# Patient Record
Sex: Female | Born: 1970 | Race: White | Hispanic: No | Marital: Married | Smoking: Current every day smoker
Health system: Southern US, Community
[De-identification: ages and names within clinical notes are randomized; demographics above are authoritative.]

## PROBLEM LIST (undated history)

## (undated) DIAGNOSIS — F419 Anxiety disorder, unspecified: Secondary | ICD-10-CM

## (undated) DIAGNOSIS — M549 Dorsalgia, unspecified: Secondary | ICD-10-CM

## (undated) HISTORY — PX: BACK SURGERY: SHX140

---

## 2001-08-12 ENCOUNTER — Emergency Department (HOSPITAL_COMMUNITY): Admission: EM | Admit: 2001-08-12 | Discharge: 2001-08-12 | Payer: Self-pay | Admitting: Emergency Medicine

## 2007-09-07 ENCOUNTER — Ambulatory Visit (HOSPITAL_COMMUNITY): Admission: RE | Admit: 2007-09-07 | Discharge: 2007-09-08 | Payer: Self-pay | Admitting: Neurosurgery

## 2007-12-11 ENCOUNTER — Encounter: Admission: RE | Admit: 2007-12-11 | Discharge: 2007-12-11 | Payer: Self-pay | Admitting: Neurosurgery

## 2008-03-03 ENCOUNTER — Encounter: Admission: RE | Admit: 2008-03-03 | Discharge: 2008-03-03 | Payer: Self-pay | Admitting: Neurosurgery

## 2008-07-23 ENCOUNTER — Encounter: Admission: RE | Admit: 2008-07-23 | Discharge: 2008-07-23 | Payer: Self-pay | Admitting: Neurosurgery

## 2009-08-27 ENCOUNTER — Ambulatory Visit (HOSPITAL_COMMUNITY): Admission: RE | Admit: 2009-08-27 | Discharge: 2009-08-27 | Payer: Self-pay | Admitting: Neurology

## 2010-06-21 ENCOUNTER — Encounter: Payer: Self-pay | Admitting: Neurosurgery

## 2010-10-12 NOTE — Op Note (Signed)
Julia Kennedy, Julia Kennedy NO.:  [id number]   MEDICAL RECORD NO.:  [id number]          PATIENT TYPE:  INP   LOCATION:  3526                         FACILITY:  [facility]   PHYSICIAN:  Hewitt Shorts, M.D.DATE OF BIRTH:  18-Oct-1970   DATE OF PROCEDURE:  DATE OF DISCHARGE:                               OPERATIVE REPORT   PREOPERATIVE DIAGNOSES:  1. Right L4-L5 and right L5-S1 lumbar disc herniation.  2. Degenerative disc disease.  3. Lumbar spondylosis.  4. Lumbar radiculopathy.   POSTOPERATIVE DIAGNOSES:  1. Right L4-L5 and right L5-S1 lumbar disc herniation.  2. Degenerative disc disease.  3. Lumbar spondylosis.  4. Lumbar radiculopathy.   PROCEDURES:  Right L4-L5 and L5-S1 lumbar laminotomy and microdiskectomy  with microdissection.   ASSISTANT:  Dr. Phoebe Perch   ANESTHESIA:  General endotracheal.   INDICATIONS:  The patient is a 40 year old woman who presented with a  right lumbar radiculopathy.  MRI scan revealed disc herniation is  essentially towards the right at both L4-5 and L5-S1 with underlying  degenerative disc disease and spondylosis.  Decision was made to proceed  with II-level lumbar laminotomy and microdiskectomy.   DESCRIPTION OF OPERATIVE PROCEDURE:  The patient was brought to the  operating room and placed under general endotracheal anesthesia.  The  patient was turned to a prone position.  Lumbar region was prepped with  Betadine soap and solution and draped in a sterile fashion.  The midline  was infiltrated with local anesthetic with epinephrine and x-ray was  taken.  The L4-L5 and L5-S1 levels were identified and a midline  incision was made and carried down through the subcutaneous tissue.  Bipolar electrocautery was used to maintain hemostasis.  Dissection was  carried down to the lumbar fascia, which was incised to the right side  of the midline and the paraspinal muscles were dissected from the  spinous process and lamina in a  subperiosteal fashion.  The L4-L5 and L5-  S1 levels were identified and another x-ray was taken to confirm the  localization.  Then, the microscope was draped and brought to the field  to provide additional navigation, illumination, and visualization.  The  remainder of the decompression was performed using microdissection and  microsurgical technique.   Laminotomy was performed on the right side of both L4-5 and L5-S1 using  the X-Max drill and Kerrison punches at each level.  The ligamentum  flavum was carefully removed and we identified the thecal sac and  exiting nerve roots.  Epidural veins were coagulated and divided as  needed and we were able to gently retract the thecal sac and nerve root  at each level and identify the disk herniation at each level.  Sketching  was performed at each level with incision of the annulus achieved with  microcurettes and pituitary rongeurs.  There is significant disc  herniation at each level.  At each level we achieved good decompression  by removing all loose fragments of disc material from both the disc  space and the epidural space.  The wound was irrigated at each level  with Bacitracin solution.  Hemostasis  was checked for and confirmed and  once decompression was completed and hemostasis was achieved we  instilled 2 mL of fentanyl and 80 mg of Depo-Medrol into the epidural  space and proceeded with closure.  The deep fascia was closed with  interrupted undyed #1 Vicryl sutures.  Scarpa fascia was closed with  interrupted undyed #1 and 2-0 undyed Vicryl sutures and the subcutaneous  and subcuticular were closed with interrupted inverted 2-0 undyed Vicryl  sutures and the skin was approximated with Dermabond.  The procedure was  tolerated well.  The estimated blood loss was 50 mL.  Sponge and needle  count were correct.  Following surgery, the patient was returned back to  the supine position, reversed from the anesthetic, extubated, and   transferred to the recovery room for further care.      Hewitt Shorts, M.D.  Electronically Signed     RWN/MEDQ  D:  09/07/2007  T:  09/08/2007  Job:  [id number]

## 2011-02-22 LAB — CBC
HCT: 39.9
Hemoglobin: 13.8
MCHC: 34.7
MCV: 88.7
Platelets: 277
RBC: 4.49
RDW: 14.4
WBC: 8.5

## 2011-05-08 ENCOUNTER — Emergency Department (HOSPITAL_COMMUNITY)
Admission: EM | Admit: 2011-05-08 | Discharge: 2011-05-08 | Disposition: A | Discharge status: Home / Self Care | Payer: Self-pay | Attending: Emergency Medicine | Admitting: Emergency Medicine

## 2011-05-08 ENCOUNTER — Encounter: Payer: Self-pay | Admitting: *Deleted

## 2011-05-08 ENCOUNTER — Other Ambulatory Visit: Payer: Self-pay

## 2011-05-08 ENCOUNTER — Emergency Department (HOSPITAL_COMMUNITY): Payer: Self-pay

## 2011-05-08 DIAGNOSIS — F17200 Nicotine dependence, unspecified, uncomplicated: Secondary | ICD-10-CM | POA: Insufficient documentation

## 2011-05-08 DIAGNOSIS — J189 Pneumonia, unspecified organism: Secondary | ICD-10-CM | POA: Insufficient documentation

## 2011-05-08 HISTORY — DX: Dorsalgia, unspecified: M54.9

## 2011-05-08 HISTORY — DX: Anxiety disorder, unspecified: F41.9

## 2011-05-08 MED ORDER — ONDANSETRON 8 MG PO TBDP
8.0000 mg | ORAL_TABLET | Freq: Once | ORAL | Status: AC
  Administered 2011-05-08: 8 mg via ORAL
  Filled 2011-05-08: qty 1

## 2011-05-08 MED ORDER — CEFTRIAXONE SODIUM 1 G IJ SOLR
1.0000 g | Freq: Once | INTRAMUSCULAR | Status: AC
  Administered 2011-05-08: 1 g via INTRAMUSCULAR
  Filled 2011-05-08: qty 10

## 2011-05-08 MED ORDER — OXYCODONE-ACETAMINOPHEN 5-325 MG PO TABS
1.0000 | ORAL_TABLET | Freq: Once | ORAL | Status: AC
  Administered 2011-05-08: 1 via ORAL
  Filled 2011-05-08: qty 1

## 2011-05-08 MED ORDER — ACETAMINOPHEN 500 MG PO TABS
1000.0000 mg | ORAL_TABLET | Freq: Once | ORAL | Status: DC
  Filled 2011-05-08: qty 2

## 2011-05-08 MED ORDER — LIDOCAINE HCL (PF) 1 % IJ SOLN
INTRAMUSCULAR | Status: AC
  Administered 2011-05-08: 2.1 mL
  Filled 2011-05-08: qty 5

## 2011-05-08 MED ORDER — AZITHROMYCIN 250 MG PO TABS: ORAL_TABLET | ORAL | Status: DC

## 2011-05-08 MED ORDER — GUAIFENESIN-CODEINE 100-10 MG/5ML PO SYRP: 10.0000 mL | ORAL_SOLUTION | Freq: Three times a day (TID) | ORAL | Status: AC | PRN

## 2011-05-08 MED ORDER — IBUPROFEN 800 MG PO TABS
800.0000 mg | ORAL_TABLET | Freq: Once | ORAL | Status: AC
  Administered 2011-05-08: 800 mg via ORAL
  Filled 2011-05-08: qty 1

## 2011-05-08 NOTE — ED Notes (Signed)
Pt reports chest/wall pain starting 1 week ago and worsening today, pt reports increased chest pain with respiration and coughing

## 2011-05-08 NOTE — ED Notes (Signed)
Pt instructed to remove jacket and blanket to help decrease her temp.

## 2011-05-08 NOTE — ED Provider Notes (Signed)
History     CSN: [id number] Arrival date & time: 05/08/2011  8:25 PM   First MD Initiated Contact with Patient 05/08/11 2048      Chief Complaint  Patient presents with  . Chest Pain  . Cough  . Anxiety    (Consider location/radiation/quality/duration/timing/severity/associated sxs/prior treatment) Patient is a 40 y.o. female presenting with cough. The history is provided by the patient.  Cough This is a new problem. The current episode started more than 2 days ago. The problem occurs every few minutes. The problem has been gradually worsening. The cough is productive of sputum. The maximum temperature recorded prior to her arrival was 102 to 102.9 F. The fever has been present for 1 to 2 days. Associated symptoms include chest pain, chills, sweats and myalgias. Pertinent negatives include no headaches, no rhinorrhea, no sore throat, no shortness of breath and no wheezing. She has tried nothing for the symptoms. The treatment provided no relief. She is a smoker. Her past medical history is significant for bronchitis. Her past medical history does not include pneumonia or asthma.    Past Medical History  Diagnosis Date  . Anxiety   . Back pain     History reviewed. No pertinent past surgical history.  History reviewed. No pertinent family history.  History  Substance Use Topics  . Smoking status: Current Everyday Smoker -- 1.0 packs/day  . Smokeless tobacco: Not on file  . Alcohol Use: No    OB History    Grav Para Term Preterm Abortions TAB SAB Ect Mult Living                  Review of Systems  Constitutional: Positive for fever and chills. Negative for activity change, appetite change and fatigue.  HENT: Positive for congestion. Negative for sore throat, rhinorrhea, trouble swallowing, neck pain and neck stiffness.   Respiratory: Positive for cough and chest tightness. Negative for shortness of breath and wheezing.   Cardiovascular: Positive for chest pain. Negative  for palpitations.  Gastrointestinal: Negative for nausea, vomiting and abdominal pain.  Genitourinary: Negative for dysuria.  Musculoskeletal: Positive for myalgias. Negative for arthralgias.  Skin: Negative for rash.  Neurological: Negative for dizziness, weakness, numbness and headaches.  Hematological: Does not bruise/bleed easily.    Allergies  Review of patient's allergies indicates no known allergies.  Home Medications  No current outpatient prescriptions on file.  BP 132/75  Pulse 96  Temp(Src) 99.2 F (37.3 C) (Oral)  Resp 24  Ht 5\' 7"  (1.702 m)  Wt 147 lb (66.679 kg)  BMI 23.02 kg/m2  SpO2 96%  LMP 04/27/2011  Physical Exam  Nursing note and vitals reviewed. Constitutional: She is oriented to person, place, and time. She appears well-developed and well-nourished.  Non-toxic appearance. She does not have a sickly appearance. No distress.  HENT:  Head: Normocephalic and atraumatic.  Right Ear: Tympanic membrane and ear canal normal.  Left Ear: Tympanic membrane and ear canal normal.  Mouth/Throat: Uvula is midline and mucous membranes are normal. Posterior oropharyngeal erythema present. No oropharyngeal exudate, posterior oropharyngeal edema or tonsillar abscesses.  Neck: Normal range of motion. Neck supple.  Cardiovascular: Normal rate, regular rhythm and normal heart sounds.   Pulmonary/Chest: Effort normal. No respiratory distress. She has no wheezes. She has no rales. She exhibits no tenderness.       Lung sounds are coarse bilaterally.  PAtient actively coughing.  No rales or wheezing  Abdominal: Soft. She exhibits no distension. There is no  tenderness.  Musculoskeletal: Normal range of motion. She exhibits no tenderness.  Lymphadenopathy:    She has no cervical adenopathy.  Neurological: She is alert and oriented to person, place, and time. No cranial nerve deficit. She exhibits normal muscle tone. Coordination normal.  Skin: Skin is warm and dry.    Psychiatric: Her speech is normal and behavior is normal. Thought content normal. Her mood appears anxious.    ED Course  Procedures (including critical care time)  Dg Chest 2 View  05/08/2011  *RADIOLOGY REPORT*  Clinical Data: Cough and fever  CHEST - 2 VIEW  Comparison: None.  Findings: Patchy right infrahilar airspace disease is evident. Left lung is clear. The cardiopericardial silhouette is within normal limits for size. Imaged bony structures of the thorax are intact.  IMPRESSION: Patchy airspace disease in the right base, suspicious for pneumonia.  Original Report Authenticated By: ERIC A. MANSELL, M.D.        MDM     Date: 05/08/2011  Rate: 97  Rhythm: normal sinus rhythm  QRS Axis: normal  Intervals: normal  ST/T Wave abnormalities: normal  Conduction Disutrbances:none  Narrative Interpretation:   Old EKG Reviewed: none available    EKG read by Dr. Clarene Duke  10:03 PM patient is feeling better, vitals improved.  No active vomiting. Non-toxic appearing.    No tachycardia or tachypnea, or hypoxia. Onset of sx's one week ago.  Given suspicion for PNA on x-ray, I will give rocephin now and start zithromax.  Pt agrees to return here in 1-2 days if the sx's are not improving  Pt feels improved after observation and/or treatment in ED.   Patient / Family / Caregiver understand and agree with initial ED impression and plan with expectations set for ED visit.   Johnpatrick Jenny L. [place], [place] 05/10/11 2153

## 2011-05-11 NOTE — ED Provider Notes (Signed)
Medical screening examination/treatment/procedure(s) were performed by non-physician practitioner and as supervising physician I was immediately available for consultation/collaboration.   Tremaine Earwood M Teaghan Formica, DO 05/11/11 1508 

## 2011-05-25 ENCOUNTER — Emergency Department (HOSPITAL_COMMUNITY): Payer: Self-pay

## 2011-05-25 ENCOUNTER — Encounter (HOSPITAL_COMMUNITY): Payer: Self-pay | Admitting: Emergency Medicine

## 2011-05-25 ENCOUNTER — Emergency Department (HOSPITAL_COMMUNITY)
Admission: EM | Admit: 2011-05-25 | Discharge: 2011-05-25 | Disposition: A | Discharge status: Home / Self Care | Payer: Self-pay | Attending: Emergency Medicine | Admitting: Emergency Medicine

## 2011-05-25 DIAGNOSIS — R10814 Left lower quadrant abdominal tenderness: Secondary | ICD-10-CM | POA: Insufficient documentation

## 2011-05-25 DIAGNOSIS — R109 Unspecified abdominal pain: Secondary | ICD-10-CM | POA: Insufficient documentation

## 2011-05-25 DIAGNOSIS — F329 Major depressive disorder, single episode, unspecified: Secondary | ICD-10-CM

## 2011-05-25 DIAGNOSIS — F3289 Other specified depressive episodes: Secondary | ICD-10-CM | POA: Insufficient documentation

## 2011-05-25 DIAGNOSIS — F17200 Nicotine dependence, unspecified, uncomplicated: Secondary | ICD-10-CM | POA: Insufficient documentation

## 2011-05-25 DIAGNOSIS — F411 Generalized anxiety disorder: Secondary | ICD-10-CM | POA: Insufficient documentation

## 2011-05-25 LAB — URINALYSIS, ROUTINE W REFLEX MICROSCOPIC
Bilirubin Urine: NEGATIVE
Nitrite: NEGATIVE
Protein, ur: NEGATIVE mg/dL
Specific Gravity, Urine: 1.01 (ref 1.005–1.030)
Urobilinogen, UA: 0.2 mg/dL (ref 0.0–1.0)
pH: 6 (ref 5.0–8.0)

## 2011-05-25 LAB — COMPREHENSIVE METABOLIC PANEL
ALT: <5 U/L (ref 0–35)
AST: 6 U/L (ref 0–37)
Albumin: 3.4 g/dL — ABNORMAL LOW (ref 3.5–5.2)
Alkaline Phosphatase: 65 U/L (ref 39–117)
CO2: 29 mEq/L (ref 19–32)
Chloride: 99 mEq/L (ref 96–112)
GFR calc Af Amer: >90 mL/min (ref >90)
GFR calc non Af Amer: >90 mL/min (ref >90)
Potassium: 2.9 mEq/L — ABNORMAL LOW (ref 3.5–5.1)
Sodium: 136 mEq/L (ref 135–145)
Total Bilirubin: 0.4 mg/dL (ref 0.3–1.2)

## 2011-05-25 LAB — DIFFERENTIAL
Basophils Relative: 0 % (ref 0–1)
Eosinophils Relative: 1 % (ref 0–5)
Lymphocytes Relative: 27 % (ref 12–46)

## 2011-05-25 LAB — CBC
HCT: 41.3 % (ref 36.0–46.0)
Hemoglobin: 13.8 g/dL (ref 12.0–15.0)
MCH: 29 pg (ref 26.0–34.0)
MCHC: 33.4 g/dL (ref 30.0–36.0)
RDW: 13.7 % (ref 11.5–15.5)

## 2011-05-25 LAB — RAPID URINE DRUG SCREEN, HOSP PERFORMED
Benzodiazepines: POSITIVE — AB
Opiates: NOT DETECTED
Tetrahydrocannabinol: NOT DETECTED

## 2011-05-25 LAB — URINE MICROSCOPIC-ADD ON

## 2011-05-25 LAB — PREGNANCY, URINE: Preg Test, Ur: NEGATIVE

## 2011-05-25 LAB — LIPASE, BLOOD: Lipase: 18 U/L (ref 11–59)

## 2011-05-25 MED ORDER — POTASSIUM CHLORIDE CRYS ER 20 MEQ PO TBCR
20.0000 meq | EXTENDED_RELEASE_TABLET | Freq: Once | ORAL | Status: AC
  Administered 2011-05-25: 20 meq via ORAL
  Filled 2011-05-25: qty 1

## 2011-05-25 MED ORDER — SODIUM CHLORIDE 0.9 % IV BOLUS (SEPSIS)
1000.0000 mL | Freq: Once | INTRAVENOUS | Status: AC
  Administered 2011-05-25: 1000 mL via INTRAVENOUS

## 2011-05-25 MED ORDER — IOHEXOL 300 MG/ML  SOLN
100.0000 mL | Freq: Once | INTRAMUSCULAR | Status: AC | PRN
  Administered 2011-05-25: 100 mL via INTRAVENOUS

## 2011-05-25 MED ORDER — PANTOPRAZOLE SODIUM 40 MG IV SOLR
40.0000 mg | Freq: Once | INTRAVENOUS | Status: AC
  Administered 2011-05-25: 40 mg via INTRAVENOUS
  Filled 2011-05-25: qty 40

## 2011-05-25 MED ORDER — ONDANSETRON HCL 4 MG/2ML IJ SOLN
4.0000 mg | Freq: Once | INTRAMUSCULAR | Status: AC
  Administered 2011-05-25: 4 mg via INTRAVENOUS
  Filled 2011-05-25: qty 2

## 2011-05-25 MED ORDER — RANITIDINE HCL 150 MG PO CAPS: 150.0000 mg | ORAL_CAPSULE | Freq: Two times a day (BID) | ORAL | Status: AC

## 2011-05-25 NOTE — ED Notes (Signed)
Patient not eating x3 weeks. Per best friend patient depressed recently seprated from husband. Patient c/o mid abd pain with nausea and vomiting.

## 2011-05-25 NOTE — ED Provider Notes (Signed)
History    This chart was scribed for Julia Lennert, MD, MD by Julia Kennedy. The patient was seen in room APA15 and the patient's care was started at 5:50PM.   CSN: [id number]  Arrival date & time 05/25/11  1557   First MD Initiated Contact with Patient 05/25/11 1748      Chief Complaint  Patient presents with  . Depression    not eating  . Abdominal Pain  . Medical Clearance    (Consider location/radiation/quality/duration/timing/severity/associated sxs/prior treatment) Patient is a 40 y.o. female presenting with abdominal pain. The history is provided by the patient.  Abdominal Pain The primary symptoms of the illness include abdominal pain.   Julia Kennedy is a 40 y.o. female who presents to the Emergency Department complaining of not being able to eat onset 3 weeks.She states she was depressed due to husband leaving her 3 weeks ago. Pt has been seeing a psychiatrist for anxiety but he doesn't know about this specific situation. Symptoms have been constant since onset. She denies SI and HI. Eating food causes pt to vomit. Pt denies any other pain. She reports having nausea. Pt has had back surgery 9 months ago and c-section 9 years ago. Pt reports the flank pain that just started hurting.   Past Medical History  Diagnosis Date  . Anxiety   . Back pain     Past Surgical History  Procedure Date  . Back surgery     Family History  Problem Relation Age of Onset  . Cancer Other   . Stroke Other   . Seizures Other   . Diabetes Other   . Hypertension Other     History  Substance Use Topics  . Smoking status: Current Everyday Smoker -- 1.0 packs/day for 20 years    Types: Cigarettes  . Smokeless tobacco: Never Used  . Alcohol Use: No    OB History    Grav Para Term Preterm Abortions TAB SAB Ect Mult Living   4 4 4       4       Review of Systems  Gastrointestinal: Positive for abdominal pain.  All other systems reviewed and are negative.   10 Systems  reviewed and are negative for acute change except as noted in the HPI.  Allergies  Review of patient's allergies indicates no known allergies.  Home Medications   Current Outpatient Rx  Name Route Sig Dispense Refill  . ALPRAZOLAM 2 MG PO TABS Oral Take 2 mg by mouth 5 (five) times daily as needed. For anxiety     . HYDROCODONE-ACETAMINOPHEN 7.5-500 MG PO TABS Oral Take 1 tablet by mouth every 6 (six) hours as needed. For pain     . NABUMETONE 750 MG PO TABS Oral Take 750 mg by mouth 2 (two) times daily.      Marland Kitchen RANITIDINE HCL 150 MG PO CAPS Oral Take 1 capsule (150 mg total) by mouth 2 (two) times daily. 60 capsule 0    BP 146/105  Pulse 92  Temp(Src) 98.2 F (36.8 C) (Oral)  Resp 18  Ht 5\' 11"  (1.803 m)  Wt 137 lb 1.6 oz (62.188 kg)  BMI 19.12 kg/m2  SpO2 100%  LMP 05/17/2011  Physical Exam  Nursing note and vitals reviewed. Constitutional: She is oriented to person, place, and time. She appears well-developed and well-nourished. No distress.  HENT:  Head: Normocephalic and atraumatic.  Eyes: Conjunctivae and EOM are normal. No scleral icterus.  Neck: Neck supple. No  thyromegaly present.  Cardiovascular: Normal rate and regular rhythm.  Exam reveals no gallop and no friction rub.   No murmur heard. Pulmonary/Chest: No stridor. No respiratory distress. She has no wheezes. She has no rales. She exhibits no tenderness.  Abdominal: She exhibits no distension. There is Tenderness: moderate LLQ.Marland Kitchen There is no rebound.  Musculoskeletal: Normal range of motion. She exhibits no edema.  Lymphadenopathy:    She has no cervical adenopathy.  Neurological: She is oriented to person, place, and time. Coordination normal.  Skin: No rash noted. No erythema.  Psychiatric: Her behavior is normal.       Depressed and hasnt been eating. Not SI or HI    ED Course  Procedures (including critical care time)  DIAGNOSTIC STUDIES: Oxygen Saturation is 100% on room air, normal by my  interpretation.    COORDINATION OF CARE:  7:47PM: Recheck: EDP discusses lab results and treatment course/ physiatrist appointment.      Labs Reviewed  COMPREHENSIVE METABOLIC PANEL - Abnormal; Notable for the following:    Potassium 2.9 (*)    Albumin 3.4 (*)    All other components within normal limits  CBC  DIFFERENTIAL  ETHANOL  LIPASE, BLOOD  URINALYSIS, ROUTINE W REFLEX MICROSCOPIC  PREGNANCY, URINE  URINE RAPID DRUG SCREEN (HOSP PERFORMED)   Ct Abdomen Pelvis W Contrast  05/25/2011  *RADIOLOGY REPORT*  Clinical Data: Abdominal pain, back pain, medical clearance  CT ABDOMEN AND PELVIS WITH CONTRAST  Technique:  Multidetector CT imaging of the abdomen and pelvis was performed following the standard protocol during bolus administration of intravenous contrast.  Contrast: OMNIPAQUE IOHEXOL 300 MG/ML IV SOLN  Comparison: None.  Findings: Lung bases are clear.  Liver is notable for a tiny hepatic cyst.  Spleen, pancreas, and adrenal glands are within normal limits.  Gallbladder is unremarkable.  No intrahepatic or extrahepatic ductal dilatation.  Kidneys within normal limits.  No hydronephrosis.  No evidence of bowel obstruction.  Normal appendix.  Atherosclerotic calcifications of the abdominal aorta and branch vessels.  No abdominopelvic ascites.  No suspicious abdominopelvic lymphadenopathy.  Uterus and right ovary are unremarkable.  3.9 cm left ovarian cyst / follicle, likely physiologic.  Bladder is within normal limits.  Mild degenerative changes of the visualized thoracolumbar spine, most prominent at L4-5.  IMPRESSION: Normal appendix.  No evidence of bowel obstruction.  No CT findings to account for the patient's abdominal pain.  Original Report Authenticated By: Charline Bills, M.D.     1. Depression   2. Abdominal pain    Pt not suicidal or homicidal   MDM  abd pain from depression.        The chart was scribed for me under my direct supervision.  I  personally performed the history, physical, and medical decision making and all procedures in the evaluation of this patient.Julia Lennert, MD 05/25/11 [phone]

## 2011-09-06 ENCOUNTER — Other Ambulatory Visit: Payer: Self-pay | Admitting: Neurology

## 2011-09-06 DIAGNOSIS — R531 Weakness: Secondary | ICD-10-CM

## 2011-09-12 ENCOUNTER — Ambulatory Visit (HOSPITAL_COMMUNITY)
Admission: RE | Admit: 2011-09-12 | Discharge: 2011-09-12 | Disposition: A | Discharge status: Home / Self Care | Payer: Medicaid Other | Source: Ambulatory Visit | Attending: Neurology | Admitting: Neurology

## 2011-09-12 DIAGNOSIS — R531 Weakness: Secondary | ICD-10-CM

## 2011-09-12 DIAGNOSIS — R51 Headache: Secondary | ICD-10-CM | POA: Insufficient documentation

## 2012-04-16 ENCOUNTER — Other Ambulatory Visit: Payer: Self-pay | Admitting: Anesthesiology

## 2012-04-16 DIAGNOSIS — M5412 Radiculopathy, cervical region: Secondary | ICD-10-CM

## 2012-04-16 DIAGNOSIS — M47812 Spondylosis without myelopathy or radiculopathy, cervical region: Secondary | ICD-10-CM

## 2012-04-23 ENCOUNTER — Ambulatory Visit
Admission: RE | Admit: 2012-04-23 | Discharge: 2012-04-23 | Disposition: A | Discharge status: Home / Self Care | Payer: Medicaid Other | Source: Ambulatory Visit | Attending: Anesthesiology | Admitting: Anesthesiology

## 2012-04-23 DIAGNOSIS — M47812 Spondylosis without myelopathy or radiculopathy, cervical region: Secondary | ICD-10-CM

## 2012-04-23 DIAGNOSIS — M5412 Radiculopathy, cervical region: Secondary | ICD-10-CM

## 2013-02-07 IMAGING — CT CT ABD-PELV W/ CM
2 of 4 series · 16 of 46 positions shown, 18 images · IV contrast (Omnipaque 300)
Comparison: None.

CLINICAL DATA: Abdominal pain, back pain, medical clearance

CT ABDOMEN AND PELVIS WITH CONTRAST
TECHNIQUE: Multidetector CT imaging of the abdomen and pelvis was
performed following the standard protocol during bolus
administration of intravenous contrast.
Contrast: 100mL OMNIPAQUE IOHEXOL 300 MG/ML IV SOLN

[Series 2: abd_pel_with 5.0 b40f · axial · 0.72mm/px · z∈[-540,-134]mm · 13 of 89 slices shown, 15 images]
[im 4/89  soft-tissue]
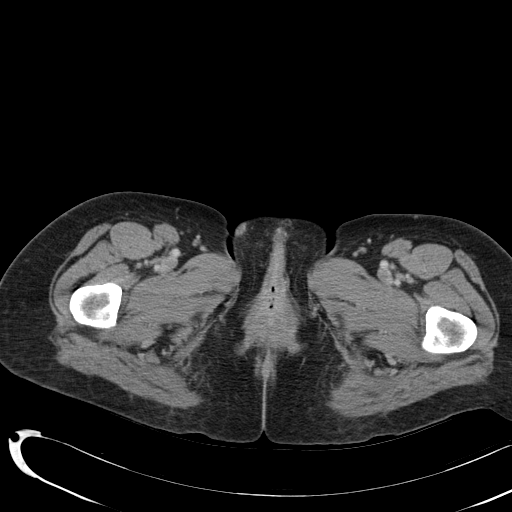
[im 4/89  bone]
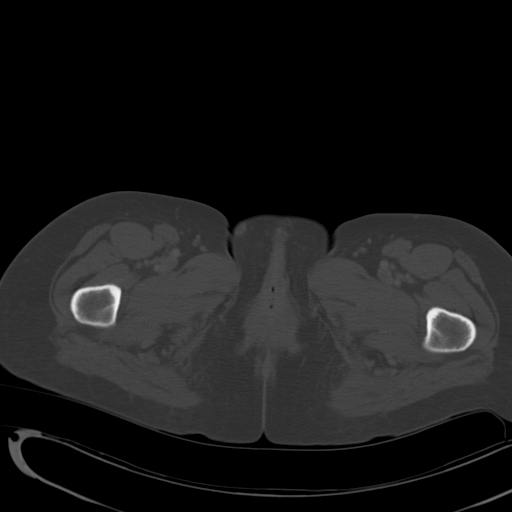
[im 11/89  soft-tissue]
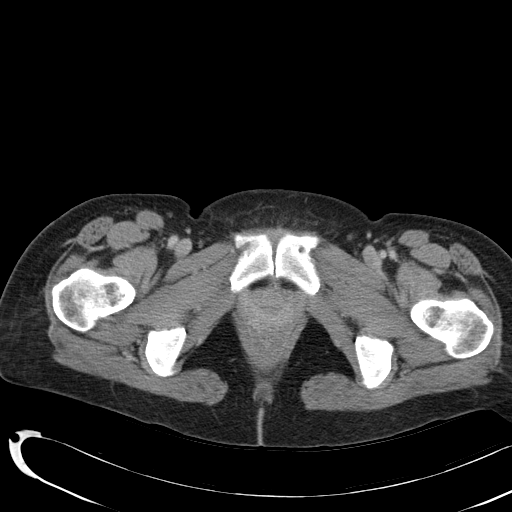
[im 18/89  soft-tissue]
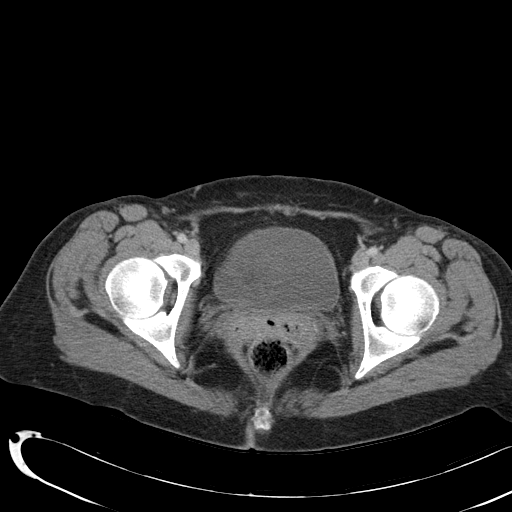
[im 25/89  soft-tissue]
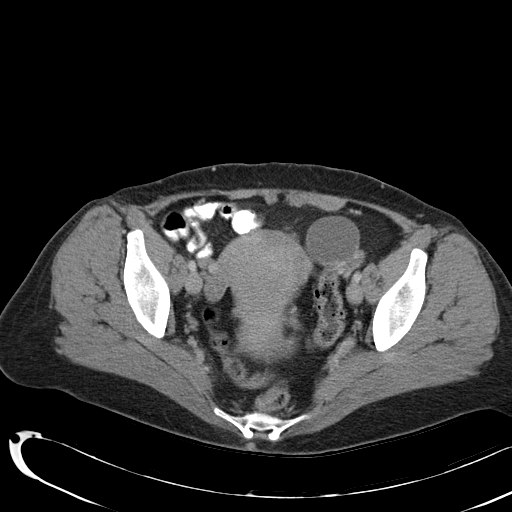
[im 32/89  soft-tissue]
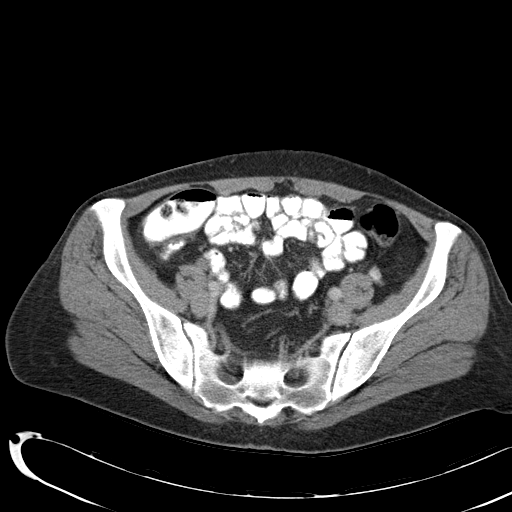
[im 39/89  soft-tissue]
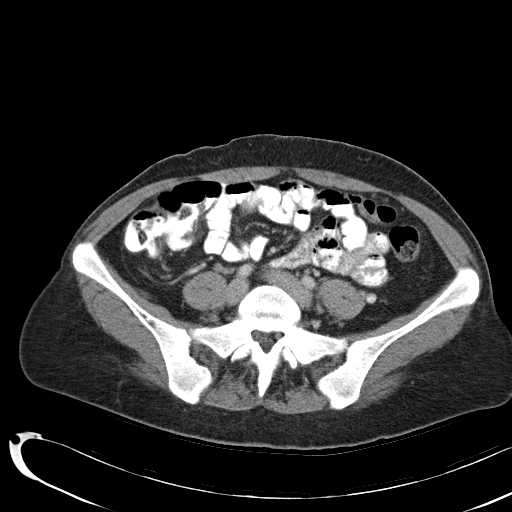
[im 46/89  soft-tissue]
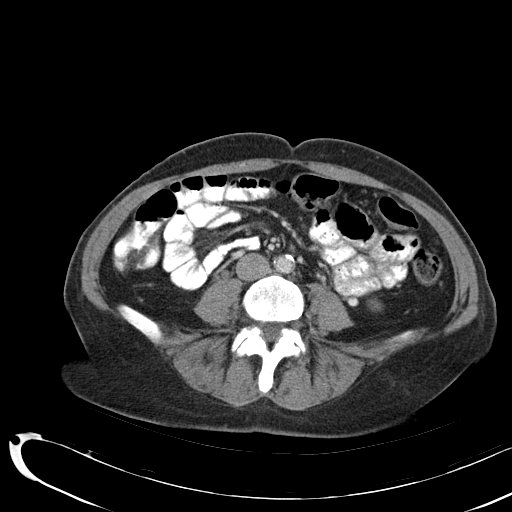
[im 50/89  soft-tissue]
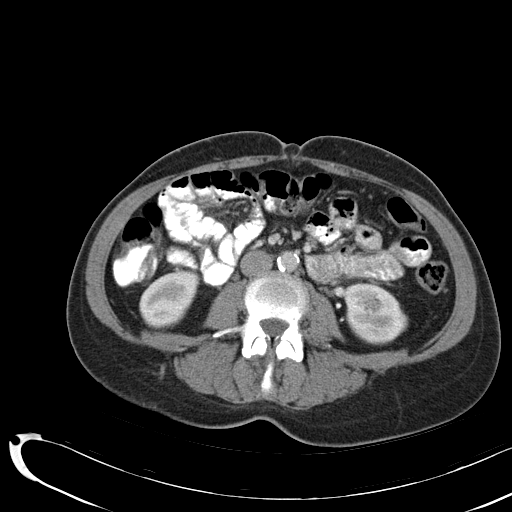
[im 57/89  soft-tissue]
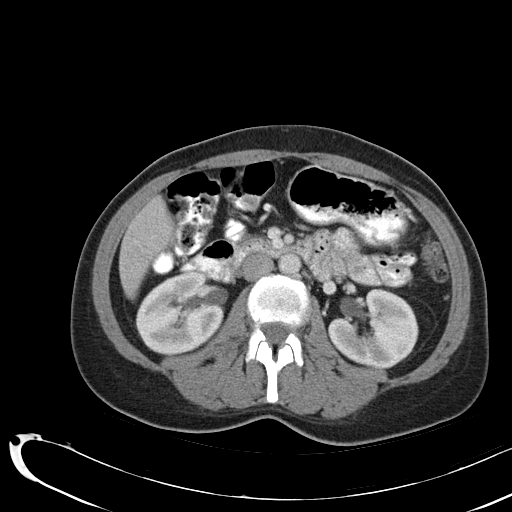
[im 57/89  bone]
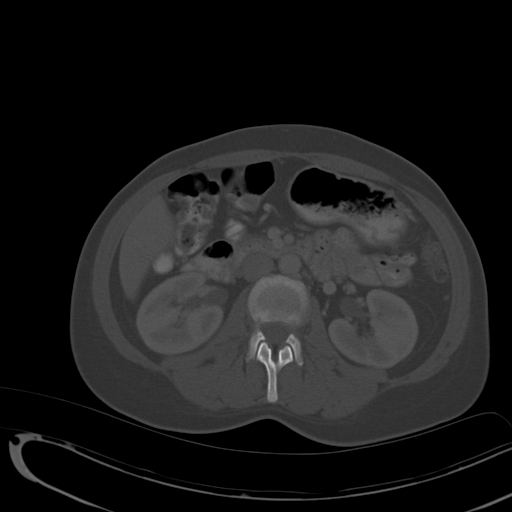
[im 64/89  soft-tissue]
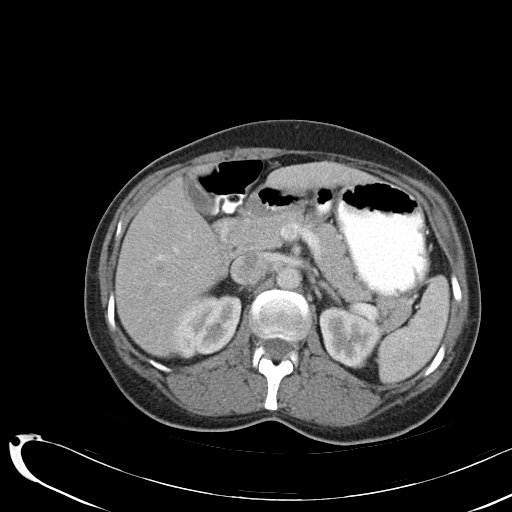
[im 71/89  soft-tissue]
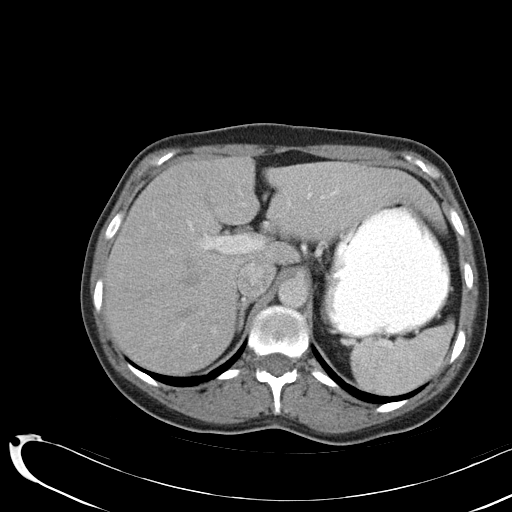
[im 78/89  soft-tissue]
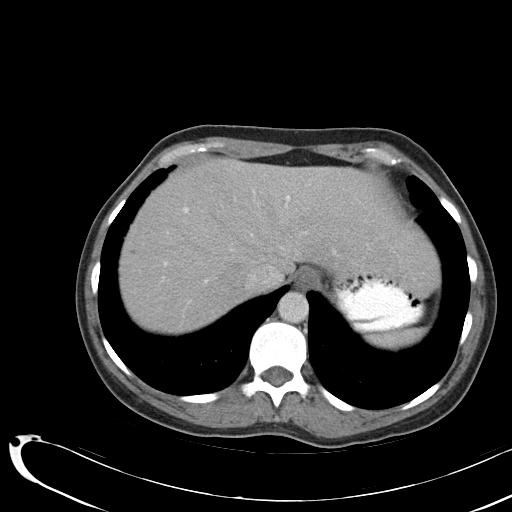
[im 85/89  soft-tissue]
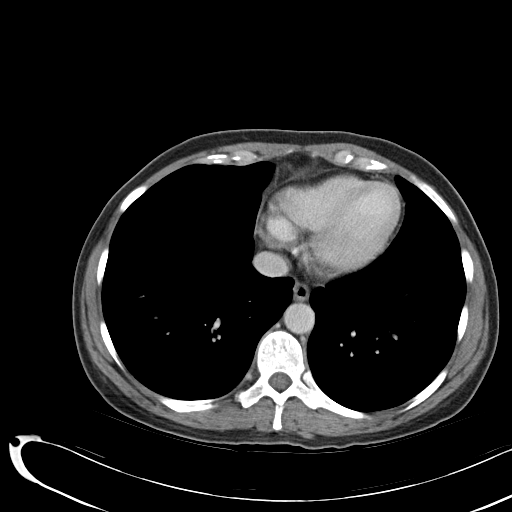

[Series 4: abd_pel_with 3.0 spo · coronal · 0.59mm/px · 3 of 73 slices shown]
[im 25/73  soft-tissue]
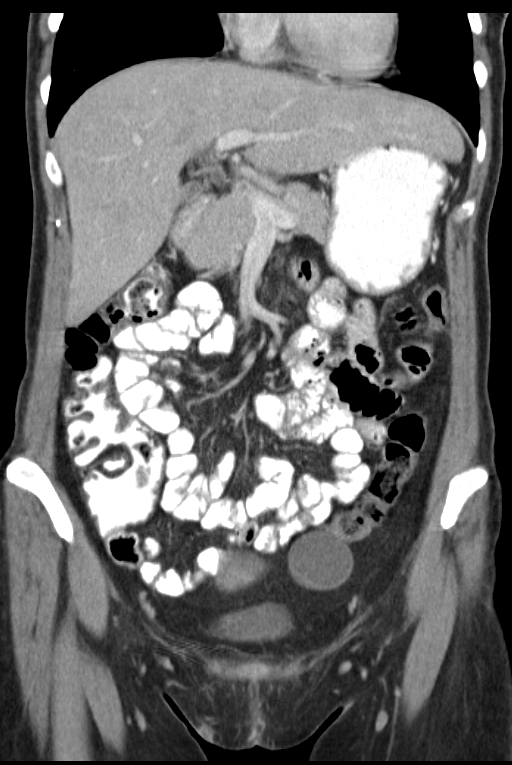
[im 33/73  soft-tissue]
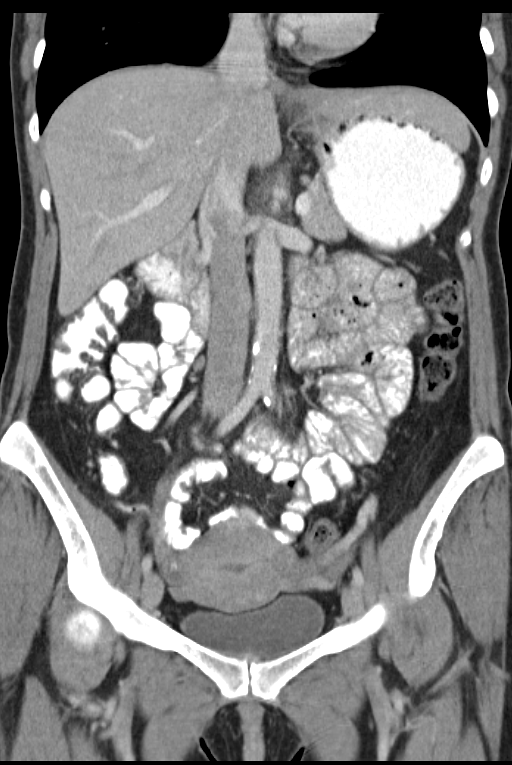
[im 41/73  soft-tissue]
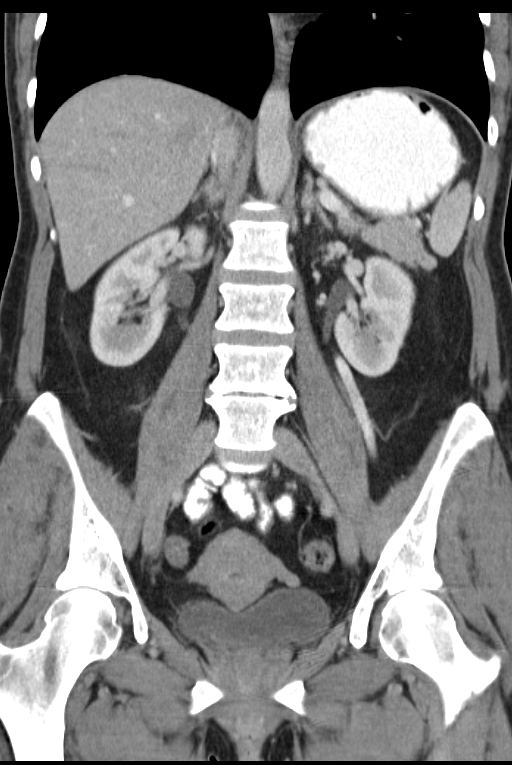

[16 of 46 positions shown; findings below may reference images not displayed]

FINDINGS: Lung bases are clear.

Liver is notable for a tiny hepatic cyst.

Spleen, pancreas, and adrenal glands are within normal limits.

Gallbladder is unremarkable.  No intrahepatic or extrahepatic
ductal dilatation.

Kidneys within normal limits.  No hydronephrosis.

No evidence of bowel obstruction.  Normal appendix.

Atherosclerotic calcifications of the abdominal aorta and branch
vessels.

No abdominopelvic ascites.

No suspicious abdominopelvic lymphadenopathy.

Uterus and right ovary are unremarkable.  3.9 cm left ovarian cyst
/ follicle, likely physiologic.

Bladder is within normal limits.

Mild degenerative changes of the visualized thoracolumbar spine,
most prominent at L4-5.
IMPRESSION: Normal appendix.  No evidence of bowel obstruction.

No CT findings to account for the patient's abdominal pain.

## 2014-03-31 ENCOUNTER — Encounter (HOSPITAL_COMMUNITY): Payer: Self-pay | Admitting: Emergency Medicine

## 2017-10-29 ENCOUNTER — Other Ambulatory Visit: Payer: Self-pay

## 2017-10-29 ENCOUNTER — Encounter (HOSPITAL_COMMUNITY): Payer: Self-pay | Admitting: *Deleted

## 2017-10-29 ENCOUNTER — Emergency Department (HOSPITAL_COMMUNITY)
Admission: EM | Admit: 2017-10-29 | Discharge: 2017-10-29 | Disposition: A | Discharge status: Home / Self Care | Payer: Self-pay | Attending: Emergency Medicine | Admitting: Emergency Medicine

## 2017-10-29 DIAGNOSIS — N939 Abnormal uterine and vaginal bleeding, unspecified: Secondary | ICD-10-CM

## 2017-10-29 DIAGNOSIS — N938 Other specified abnormal uterine and vaginal bleeding: Secondary | ICD-10-CM | POA: Insufficient documentation

## 2017-10-29 DIAGNOSIS — F17210 Nicotine dependence, cigarettes, uncomplicated: Secondary | ICD-10-CM | POA: Insufficient documentation

## 2017-10-29 DIAGNOSIS — Z79899 Other long term (current) drug therapy: Secondary | ICD-10-CM | POA: Insufficient documentation

## 2017-10-29 LAB — CBC WITH DIFFERENTIAL/PLATELET
BASOS ABS: 0 10*3/uL (ref 0.0–0.1)
BASOS PCT: 0 %
EOS ABS: 0.1 10*3/uL (ref 0.0–0.7)
EOS PCT: 1 %
HCT: 40.9 % (ref 36.0–46.0)
Hemoglobin: 13.8 g/dL (ref 12.0–15.0)
Lymphocytes Relative: 31 %
Lymphs Abs: 2.2 10*3/uL (ref 0.7–4.0)
MCH: 30.5 pg (ref 26.0–34.0)
MCHC: 33.7 g/dL (ref 30.0–36.0)
MCV: 90.5 fL (ref 78.0–100.0)
MONO ABS: 0.5 10*3/uL (ref 0.1–1.0)
MONOS PCT: 8 %
Neutro Abs: 4.3 10*3/uL (ref 1.7–7.7)
Neutrophils Relative %: 60 %
PLATELETS: 321 10*3/uL (ref 150–400)
RBC: 4.52 MIL/uL (ref 3.87–5.11)
RDW: 14.2 % (ref 11.5–15.5)
WBC: 7.1 10*3/uL (ref 4.0–10.5)

## 2017-10-29 LAB — HCG, SERUM, QUALITATIVE: Preg, Serum: NEGATIVE

## 2017-10-29 MED ORDER — ESTROGENS CONJUGATED 1.25 MG PO TABS: 1.2500 mg | ORAL_TABLET | Freq: Every day | ORAL | 0 refills | Status: DC

## 2017-10-29 NOTE — ED Provider Notes (Addendum)
[facility] EMERGENCY DEPARTMENT Provider Note   CSN: [id number] Arrival date & time: 10/29/17  1640     History   Chief Complaint Chief Complaint  Patient presents with  . Vaginal Bleeding    HPI Julia Kennedy is a 47 y.o. female.  Complaint is prolonged vaginal bleeding.  HPI 47 year old female.  Has normal menstrual cycles.  Every 28 to 30 days typically.  Started having some bleeding on 515.  Typical for.  Had 3 days where it was lower.  Is not heavier bleeding with occasional clotting since that time.  Is not weak dizzy or lightheaded.  Is not syncopal.  Does not have significant pelvic pain.  No vaginal bleeding or discharge.  Recent menopausal symptoms.  No night sweats, or irritability.  Past Medical History:  Diagnosis Date  . Anxiety   . Back pain     There are no active problems to display for this patient.   Past Surgical History:  Procedure Laterality Date  . BACK SURGERY       OB History    Gravida  4   Para  4   Term  4   Preterm      AB      Living  4     SAB      TAB      Ectopic      Multiple      Live Births               Home Medications    Prior to Admission medications   Medication Sig Start Date End Date Taking? Authorizing Provider  alprazolam Prudy Feeler(XANAX) 2 MG tablet Take 2 mg by mouth 5 (five) times daily as needed. For anxiety    Yes [provider]  ibuprofen (ADVIL,MOTRIN) 200 MG tablet Take 800 mg by mouth every 8 (eight) hours as needed for moderate pain.   Yes [provider]  estrogens, conjugated, (PREMARIN) 1.25 MG tablet Take 1 tablet (1.25 mg total) by mouth daily. 10/29/17   Rolland PorterJames, Genieve Ramaswamy, MD  HYDROcodone-acetaminophen (LORTAB) 7.5-500 MG per tablet Take 1 tablet by mouth every 6 (six) hours as needed. For pain     [provider]  nabumetone (RELAFEN) 750 MG tablet Take 750 mg by mouth 2 (two) times daily.      [provider]  ranitidine (ZANTAC) 150 MG capsule Take 1  capsule (150 mg total) by mouth 2 (two) times daily. 05/25/11 05/24/12  Bethann BerkshireZammit, Joseph, MD    Family History Family History  Problem Relation Age of Onset  . Cancer Other   . Stroke Other   . Seizures Other   . Diabetes Other   . Hypertension Other     Social History Social History   Tobacco Use  . Smoking status: Current Every Day Smoker    Packs/day: 1.00    Years: 20.00    Pack years: 20.00    Types: Cigarettes  . Smokeless tobacco: Never Used  Substance Use Topics  . Alcohol use: No  . Drug use: No     Allergies   Patient has no known allergies.   Review of Systems Review of Systems  Constitutional: Negative for appetite change, chills, diaphoresis, fatigue and fever.  HENT: Negative for mouth sores, sore throat and trouble swallowing.   Eyes: Negative for visual disturbance.  Respiratory: Negative for cough, chest tightness, shortness of breath and wheezing.   Cardiovascular: Negative for chest pain.  Gastrointestinal: Negative for abdominal  distention, abdominal pain, diarrhea, nausea and vomiting.  Endocrine: Negative for polydipsia, polyphagia and polyuria.  Genitourinary: Positive for vaginal bleeding. Negative for dysuria, frequency and hematuria.  Musculoskeletal: Negative for gait problem.  Skin: Negative for color change, pallor and rash.  Neurological: Negative for dizziness, syncope, light-headedness and headaches.  Hematological: Does not bruise/bleed easily.  Psychiatric/Behavioral: Negative for behavioral problems and confusion.     Physical Exam Updated Vital Signs BP (!) 142/76 (BP Location: Right Arm)   Pulse 87   Temp 98.2 F (36.8 C) (Oral)   Resp 20   Ht 5\' 11"  (1.803 m)   Wt 64.9 kg (143 lb)   LMP 10/11/2017   SpO2 100%   BMI 19.94 kg/m   Physical Exam  Constitutional: She is oriented to person, place, and time. She appears well-developed and well-nourished. No distress.  HENT:  Head: Normocephalic.  Eyes: Pupils are equal,  round, and reactive to light. Conjunctivae are normal. No scleral icterus.  Neck: Normal range of motion. Neck supple. No thyromegaly present.  Cardiovascular: Normal rate and regular rhythm. Exam reveals no gallop and no friction rub.  No murmur heard. Pulmonary/Chest: Effort normal and breath sounds normal. No respiratory distress. She has no wheezes. She has no rales.  Abdominal: Soft. Bowel sounds are normal. She exhibits no distension. There is no tenderness. There is no rebound.  Musculoskeletal: Normal range of motion.  Neurological: She is alert and oriented to person, place, and time.  Skin: Skin is warm and dry. No rash noted.  Psychiatric: She has a normal mood and affect. Her behavior is normal.     ED Treatments / Results  Labs (all labs ordered are listed, but only abnormal results are displayed) Labs Reviewed  CBC WITH DIFFERENTIAL/PLATELET  HCG, SERUM, QUALITATIVE    EKG None  Radiology No results found.  Procedures Procedures (including critical care time)  Medications Ordered in ED Medications - No data to display   Initial Impression / Assessment and Plan / ED Course  I have reviewed the triage vital signs and the nursing notes.  Pertinent labs & imaging results that were available during my care of the patient were reviewed by me and considered in my medical decision making (see chart for details).    Not anemic.  Not pregnant.  Politely declines pelvic exam.  States she had one in the last 2 months with her GYN after not having one for 7 years.  She has a GYN exam in 3 weeks.  Her main concern was to make sure she "was not getting in trouble".  Also states that she is going on her honeymoon was "did not have bleeding during that.  Will prescribe her daily Premarin dose for the next 7 days to temporize her bleeding until follow-up with GYN.  Final Clinical Impressions(s) / ED Diagnoses   Final diagnoses:  Vaginal bleeding    ED Discharge Orders         Ordered    estrogens, conjugated, (PREMARIN) 1.25 MG tablet  Daily     10/29/17 1901       Rolland Porter, MD 10/29/17 Julian Reil    Rolland Porter, MD 11/09/17 [phone]

## 2017-10-29 NOTE — Discharge Instructions (Addendum)
Premarin daily to stop bleeding.  After stopping premarin, you will have some withdraw bleeding. Keep your OB/GYN appointment in 3 weeks. Have a GREAT Honeymoon!

## 2017-10-29 NOTE — ED Triage Notes (Signed)
Pt with vaginal bleeding since 15th of May, has passed a couple of clots in the past. Pt denies sob with exertion, light headed earlier but pt believes from the heat due to working out in the yard.

## 2017-10-30 ENCOUNTER — Telehealth: Payer: Self-pay | Admitting: *Deleted

## 2017-10-30 NOTE — Telephone Encounter (Signed)
Pharmacy called related to Rx: Premarin as it is not covered by her insurance...EDCM clarified with EDP (Hedges) to change Rx to: estrodial tablets.

## 2019-07-14 ENCOUNTER — Other Ambulatory Visit: Payer: Self-pay

## 2019-07-14 ENCOUNTER — Emergency Department (HOSPITAL_COMMUNITY)
Admission: EM | Admit: 2019-07-14 | Discharge: 2019-07-14 | Disposition: A | Discharge status: Home / Self Care | Payer: Medicaid Other | Attending: Emergency Medicine | Admitting: Emergency Medicine

## 2019-07-14 ENCOUNTER — Encounter (HOSPITAL_COMMUNITY): Payer: Self-pay | Admitting: *Deleted

## 2019-07-14 DIAGNOSIS — F17210 Nicotine dependence, cigarettes, uncomplicated: Secondary | ICD-10-CM | POA: Insufficient documentation

## 2019-07-14 DIAGNOSIS — K047 Periapical abscess without sinus: Secondary | ICD-10-CM | POA: Insufficient documentation

## 2019-07-14 DIAGNOSIS — Z79899 Other long term (current) drug therapy: Secondary | ICD-10-CM | POA: Insufficient documentation

## 2019-07-14 MED ORDER — HYDROCODONE-ACETAMINOPHEN 5-325 MG PO TABS: 1.0000 | ORAL_TABLET | ORAL | 0 refills | Status: AC | PRN

## 2019-07-14 MED ORDER — CLINDAMYCIN PHOSPHATE 600 MG/50ML IV SOLN
600.0000 mg | Freq: Once | INTRAVENOUS | Status: AC
  Administered 2019-07-14: 600 mg via INTRAVENOUS
  Filled 2019-07-14: qty 50

## 2019-07-14 MED ORDER — CLINDAMYCIN HCL 150 MG PO CAPS: 450.0000 mg | ORAL_CAPSULE | Freq: Three times a day (TID) | ORAL | 0 refills | Status: AC

## 2019-07-14 MED ORDER — HYDROCODONE-ACETAMINOPHEN 5-325 MG PO TABS: 2.0000 | ORAL_TABLET | ORAL | 0 refills | Status: DC | PRN

## 2019-07-14 MED ORDER — HYDROCODONE-ACETAMINOPHEN 5-325 MG PO TABS
1.0000 | ORAL_TABLET | ORAL | 0 refills | Status: AC | PRN
Start: 2019-07-14 — End: ?

## 2019-07-14 NOTE — ED Provider Notes (Signed)
[facility] EMERGENCY DEPARTMENT Provider Note   CSN: [id number] Arrival date & time: 07/14/19  1719     History Chief Complaint  Patient presents with  . Dental Pain    Julia Kennedy is a 49 y.o. female presenting with left-sided dental pain and swelling.  She saw her dentist 2 days ago for treatment of a filling which had fallen out of her left lower second molar tooth.  She was told she had an exposed root and she needed to either a root canal or dental extraction.  However she also had brewing infection so was placed on Keflex and Tylenol 3.  She is had 9 doses of the Keflex and she reports increased pain and swelling along her jawline.  She denies fevers or chills and has had no difficulty swallowing although eating has been painful.  She denies mouth tongue or throat swelling.  She also states the Tylenol 3 is making her very nauseated which should happen in the past.  She has been able to tolerate hydrocodone without similar symptoms.  HPI     Past Medical History:  Diagnosis Date  . Anxiety   . Back pain     There are no problems to display for this patient.   Past Surgical History:  Procedure Laterality Date  . BACK SURGERY       OB History    Gravida  4   Para  4   Term  4   Preterm      AB      Living  4     SAB      TAB      Ectopic      Multiple      Live Births              Family History  Problem Relation Age of Onset  . Cancer Other   . Stroke Other   . Seizures Other   . Diabetes Other   . Hypertension Other     Social History   Tobacco Use  . Smoking status: Current Every Day Smoker    Packs/day: 0.50    Years: 20.00    Pack years: 10.00    Types: Cigarettes  . Smokeless tobacco: Never Used  Substance Use Topics  . Alcohol use: No  . Drug use: No    Home Medications Prior to Admission medications   Medication Sig Start Date End Date Taking? Authorizing Provider  ibuprofen (ADVIL) 800 MG tablet Take 800 mg by  mouth every 8 (eight) hours as needed for mild pain or moderate pain.   Yes [provider]  ibuprofen (ADVIL,MOTRIN) 200 MG tablet Take 400 mg by mouth every 8 (eight) hours as needed for moderate pain.    Yes [provider]  Multiple Vitamins-Minerals (AIRBORNE GUMMIES PO) Take 2 each by mouth daily.   Yes [provider]  clindamycin (CLEOCIN) 150 MG capsule Take 3 capsules (450 mg total) by mouth 3 (three) times daily for 7 days. 07/14/19 07/21/19  Burgess Amor, PA-C  HYDROcodone-acetaminophen (NORCO/VICODIN) 5-325 MG tablet Take 1 tablet by mouth every 4 (four) hours as needed. 07/14/19   Burgess Amor, PA-C  ranitidine (ZANTAC) 150 MG capsule Take 1 capsule (150 mg total) by mouth 2 (two) times daily. 05/25/11 05/24/12  Bethann Berkshire, MD    Allergies    Tylenol with codeine #3 [acetaminophen-codeine]  Review of Systems   Review of Systems  Constitutional: Negative for fever.  HENT:  Positive for dental problem. Negative for facial swelling and sore throat.   Respiratory: Negative for shortness of breath.   Musculoskeletal: Negative for neck pain and neck stiffness.    Physical Exam Updated Vital Signs BP (!) 156/80   Pulse 62   Temp 98.2 F (36.8 C) (Oral)   Resp 18   Ht 5\' 9"  (1.753 m)   Wt 66.7 kg   SpO2 100%   BMI 21.71 kg/m   Physical Exam Constitutional:      General: She is not in acute distress.    Appearance: She is well-developed.  HENT:     Head: Normocephalic and atraumatic.     Jaw: No trismus.     Right Ear: Tympanic membrane and external ear normal.     Left Ear: Tympanic membrane and external ear normal.     Mouth/Throat:     Mouth: No oral lesions.     Dentition: Abnormal dentition. Dental tenderness, dental caries and dental abscesses present.   Eyes:     Conjunctiva/sclera: Conjunctivae normal.  Cardiovascular:     Rate and Rhythm: Normal rate.     Heart sounds: Normal heart sounds.  Pulmonary:     Effort: Pulmonary  effort is normal.  Abdominal:     General: There is no distension.  Musculoskeletal:        General: Normal range of motion.     Cervical back: Normal range of motion and neck supple.  Lymphadenopathy:     Cervical: No cervical adenopathy.  Skin:    General: Skin is warm and dry.     Findings: No erythema.  Neurological:     Mental Status: She is alert and oriented to person, place, and time.     ED Results / Procedures / Treatments   Labs (all labs ordered are listed, but only abnormal results are displayed) Labs Reviewed - No data to display  EKG None  Radiology No results found.  Procedures Procedures (including critical care time)  Medications Ordered in ED Medications  clindamycin (CLEOCIN) IVPB 600 mg (600 mg Intravenous New Bag/Given 07/14/19 1939)    ED Course  I have reviewed the triage vital signs and the nursing notes.  Pertinent labs & imaging results that were available during my care of the patient were reviewed by me and considered in my medical decision making (see chart for details).    MDM Rules/Calculators/A&P                      Patient with dental infection/abscess which has not responded to 3-day course of Keflex.  She was switched to clindamycin with first dose given IV here.  At reviewing the [place] narcotic database she was switched from Tylenol 3 to hydrocodone for pain relief.  Discussed avoiding ice or heat to the site, both which can complicate this infection.  She has no trismus or induration, no signs of Ludwigs angina.  Posterior pharynx is patent.  Normal voice.  Plan close follow-up with her dentist.  Return precautions were outlined. Final Clinical Impression(s) / ED Diagnoses Final diagnoses:  Dental infection    Rx / DC Orders ED Discharge Orders         Ordered    clindamycin (CLEOCIN) 150 MG capsule  3 times daily     07/14/19 1954    HYDROcodone-acetaminophen (NORCO/VICODIN) 5-325 MG tablet  Every 4 hours PRN     07/14/19  1954  Evalee Jefferson, PA-C 07/14/19 Dena Billet, MD 07/14/19 2238

## 2019-07-14 NOTE — Discharge Instructions (Addendum)
Take your next dose of the antibiotic tomorrow morning.  I recommend eating yogurt with active yeast cultures daily - this can help prevent the possible side effect of diarrhea which can occur when taking this antibiotic.  Do not drive within 4 hours of taking hydrocodone as this can make you sleepy.  Stop taking your keflex (cephalexin) antibiotic and also stop the Tylenol #3.  Use the hydrocodone for pain relief since you know you have taken this medicine in the past without the nausea side effects.  Do not apply ice or heat to your jaw as either can worsen the infection and/or swelling.  Follow up with your dentist as needed.

## 2019-07-14 NOTE — ED Triage Notes (Addendum)
Pt c/o dental pain on left side. Pt saw her dentist on Friday and was told her filling had fell out and her nerve was exposed with infection. Pt was started on Keflex and Ibuprofen that day but wasn't having a lot of pain. Pt reports her pain got extremely worse over the last 2 days to the point that she is vomiting and not able to sleep. Pt tearful in triage. Pt's left lower jaw is swollen, tender to touch.

## 2019-07-15 MED FILL — Hydrocodone-Acetaminophen Tab 5-325 MG: ORAL | Qty: 6 | Status: AC

## 2019-07-16 ENCOUNTER — Emergency Department (HOSPITAL_COMMUNITY): Payer: Self-pay

## 2019-07-16 ENCOUNTER — Other Ambulatory Visit: Payer: Self-pay

## 2019-07-16 ENCOUNTER — Encounter (HOSPITAL_COMMUNITY): Payer: Self-pay | Admitting: Emergency Medicine

## 2019-07-16 ENCOUNTER — Emergency Department (HOSPITAL_COMMUNITY)
Admission: EM | Admit: 2019-07-16 | Discharge: 2019-07-17 | Payer: Self-pay | Attending: Emergency Medicine | Admitting: Emergency Medicine

## 2019-07-16 DIAGNOSIS — F17210 Nicotine dependence, cigarettes, uncomplicated: Secondary | ICD-10-CM | POA: Insufficient documentation

## 2019-07-16 DIAGNOSIS — K047 Periapical abscess without sinus: Secondary | ICD-10-CM | POA: Insufficient documentation

## 2019-07-16 DIAGNOSIS — R221 Localized swelling, mass and lump, neck: Secondary | ICD-10-CM | POA: Insufficient documentation

## 2019-07-16 DIAGNOSIS — Z20822 Contact with and (suspected) exposure to covid-19: Secondary | ICD-10-CM | POA: Insufficient documentation

## 2019-07-16 DIAGNOSIS — L03211 Cellulitis of face: Secondary | ICD-10-CM | POA: Insufficient documentation

## 2019-07-16 LAB — CBC WITH DIFFERENTIAL/PLATELET
Abs Immature Granulocytes: 0.02 10*3/uL (ref 0.00–0.07)
Basophils Absolute: 0 10*3/uL (ref 0.0–0.1)
Basophils Relative: 0 %
Eosinophils Absolute: 0.1 10*3/uL (ref 0.0–0.5)
Eosinophils Relative: 2 %
HCT: 40.5 % (ref 36.0–46.0)
Hemoglobin: 13.4 g/dL (ref 12.0–15.0)
Immature Granulocytes: 0 %
Lymphocytes Relative: 28 %
Lymphs Abs: 2.1 10*3/uL (ref 0.7–4.0)
MCH: 29.7 pg (ref 26.0–34.0)
MCHC: 33.1 g/dL (ref 30.0–36.0)
MCV: 89.8 fL (ref 80.0–100.0)
Monocytes Absolute: 0.6 10*3/uL (ref 0.1–1.0)
Monocytes Relative: 8 %
Neutro Abs: 4.6 10*3/uL (ref 1.7–7.7)
Neutrophils Relative %: 62 %
Platelets: 303 10*3/uL (ref 150–400)
RBC: 4.51 MIL/uL (ref 3.87–5.11)
RDW: 14.6 % (ref 11.5–15.5)
WBC: 7.5 10*3/uL (ref 4.0–10.5)
nRBC: 0 % (ref 0.0–0.2)

## 2019-07-16 LAB — BASIC METABOLIC PANEL
Anion gap: 12 (ref 5–15)
BUN: 11 mg/dL (ref 6–20)
CO2: 27 mmol/L (ref 22–32)
Calcium: 9.9 mg/dL (ref 8.9–10.3)
Chloride: 98 mmol/L (ref 98–111)
Creatinine, Ser: 0.75 mg/dL (ref 0.44–1.00)
GFR calc Af Amer: >60 mL/min (ref >60)
GFR calc non Af Amer: >60 mL/min (ref >60)
Glucose, Bld: 96 mg/dL (ref 70–99)
Potassium: 3.7 mmol/L (ref 3.5–5.1)
Sodium: 137 mmol/L (ref 135–145)

## 2019-07-16 MED ORDER — IOHEXOL 300 MG/ML  SOLN
75.0000 mL | Freq: Once | INTRAMUSCULAR | Status: AC | PRN
  Administered 2019-07-16: 23:00:00 75 mL via INTRAVENOUS

## 2019-07-16 MED ORDER — CLINDAMYCIN PHOSPHATE 600 MG/50ML IV SOLN
600.0000 mg | Freq: Once | INTRAVENOUS | Status: AC
  Administered 2019-07-17: 600 mg via INTRAVENOUS
  Filled 2019-07-16: qty 50

## 2019-07-16 MED ORDER — LORAZEPAM 2 MG/ML IJ SOLN
1.0000 mg | Freq: Once | INTRAMUSCULAR | Status: AC
  Administered 2019-07-16: 1 mg via INTRAVENOUS
  Filled 2019-07-16: qty 1

## 2019-07-16 NOTE — ED Notes (Signed)
Pt transported to CT ?

## 2019-07-16 NOTE — ED Provider Notes (Signed)
Emergency Department Provider Note   I have reviewed the triage vital signs and the nursing notes.   HISTORY  Chief Complaint Dental Pain   HPI Julia Kennedy is a 49 y.o. female returns to the emergency department with worsening left-sided dental pain with new neck swelling and discomfort.  Patient was seen in the emergency department 2 days ago with left jaw pain and swelling.  She had been on Keflex for 2 days at that point with worsening pain.  She was transitioned to clindamycin 450 mg 3 times daily and has been taking it as prescribed after initial IV dose in the ER.  He states that the swelling in her jaw has decreased but she is developed swelling now on the left side of her neck.  She has some subjective pain and reported difficulty with swallowing.  Denies any voice changes.  She notes the area is painful when she opens her mouth or touches the area.  She has not had vomiting.  Denies fevers or shortness of breath.  She states that she initially went to her dentist with these issues and was told that he would like to extract a tooth on that side but was concerned with some developing signs of infection which prompted her to start the Keflex initially.   Past Medical History:  Diagnosis Date  . Anxiety   . Back pain     There are no problems to display for this patient.   Past Surgical History:  Procedure Laterality Date  . BACK SURGERY      Allergies Tylenol with codeine #3 [acetaminophen-codeine]  Family History  Problem Relation Age of Onset  . Cancer Other   . Stroke Other   . Seizures Other   . Diabetes Other   . Hypertension Other     Social History Social History   Tobacco Use  . Smoking status: Current Every Day Smoker    Packs/day: 0.50    Years: 20.00    Pack years: 10.00    Types: Cigarettes  . Smokeless tobacco: Never Used  Substance Use Topics  . Alcohol use: No  . Drug use: No    Review of Systems  Constitutional: No  fever/chills Eyes: No visual changes. ENT: Positive left dental pain and neck pain/swelling.  Cardiovascular: Denies chest pain. Respiratory: Denies shortness of breath. Gastrointestinal: No abdominal pain.  No nausea, no vomiting.  No diarrhea.  No constipation. Genitourinary: Negative for dysuria. Musculoskeletal: Negative for back pain. Skin: Negative for rash. Neurological: Negative for headaches, focal weakness or numbness.  10-point ROS otherwise negative.  ____________________________________________   PHYSICAL EXAM:  VITAL SIGNS: ED Triage Vitals  Enc Vitals Group     BP 07/16/19 1933 (!) 160/92     Pulse Rate 07/16/19 1933 70     Resp 07/16/19 1933 16     Temp 07/16/19 1933 98.2 F (36.8 C)     Temp Source 07/16/19 1933 Oral     SpO2 07/16/19 1933 100 %     Weight 07/16/19 1934 147 lb (66.7 kg)     Height 07/16/19 1934 5\' 10"  (1.778 m)   Constitutional: Alert and oriented. Well appearing and in no acute distress. Eyes: Conjunctivae are normal.  Head: Atraumatic. Nose: No congestion/rhinnorhea. Mouth/Throat: Mucous membranes are moist. 2 finger breaths of trismus.  Patient is speaking in a clear voice and managing oral secretions.  She has tenderness with area of swelling starting underneath the left mandible and extending to the midline  and halfway down the neck on the left side.  No overlying cellulitis or warmth.  The area is mildly indurated.  No visible dental abscess but diffusely poor dentition.  Neck: No stridor.   Cardiovascular: Normal rate, regular rhythm. Good peripheral circulation. Grossly normal heart sounds.   Respiratory: Normal respiratory effort.  No retractions. Lungs CTAB. Gastrointestinal: No distention.  Musculoskeletal: No gross deformities of extremities. Neurologic:  Normal speech and language. Skin:  Skin is warm, dry and intact. No rash noted.  ____________________________________________   LABS (all labs ordered are listed, but only  abnormal results are displayed)  Labs Reviewed  RESPIRATORY PANEL BY RT PCR (FLU A&B, COVID)  BASIC METABOLIC PANEL  CBC WITH DIFFERENTIAL/PLATELET   ____________________________________________  RADIOLOGY  CT Soft Tissue Neck W Contrast  Result Date: 07/16/2019 CLINICAL DATA:  49 year old female with dental pain and subsequent left neck pain, swelling and trismus. Difficulty swallowing. EXAM: CT NECK WITH CONTRAST TECHNIQUE: Multidetector CT imaging of the neck was performed using the standard protocol following the bolus administration of intravenous contrast. CONTRAST:  74mL OMNIPAQUE IOHEXOL 300 MG/ML  SOLN COMPARISON:  Brain MRI 09/12/2011. FINDINGS: Pharynx and larynx: Negative larynx. The epiglottis is normal. There is mild hypertrophy of the lingual tonsil, palatine tonsils and adenoids but no tonsillar heterogeneity. There are small postinflammatory calcifications in the palatine tonsil. The parapharyngeal and retropharyngeal spaces remain normal. Salivary glands: The lateral left sublingual spaces abnormal along the body of the left mandible on series 2, image 58 with evidence of soft tissue edema. The central sublingual and right sublingual spaces remain normal. There is no asymmetry or inflammation of the submandibular glands identified. The left masticator space seems to remain normal. Parotid glands are symmetric and within normal limits. Thyroid: Negative. Lymph nodes: Mildly enlarged reactive appearing left level 1 B lymph nodes. Regional thickening of the platysma. No cystic or necrotic nodes. Other bilateral cervical lymph nodes appear more symmetric and normal. Vascular: Major vascular structures in the neck and at the skull base are patent including the left IJ. Mild cervical carotid atherosclerosis mostly on the right. Limited intracranial: Negative. Visualized orbits: Negative. Mastoids and visualized paranasal sinuses: Trace ethmoid sinus bubbly opacity. Other paranasal sinuses  are well pneumatized. Tympanic cavities and mastoids are clear. Skeleton: Most of the posterior mandible dentition on the right is absent. On the left the anterior left mandible molar is carious and there is some periapical lucency (series 3, image 56), and this is in proximity to the soft tissue edema surrounding the mandible in this region. There is no rim enhancing fluid collection. No mandible fracture. More normal appearing maxillary dentition. Reversed cervical spine lordosis, but no acute osseous abnormality identified. Upper chest: Negative. IMPRESSION: Suspected odontogenic infection with soft tissue inflammation surrounding the body of the left mandible, including in the lateral left sublingual space. Tooth of origin favored to be the anterior molar although there is only trace periapical lucency. Periosteal edema on both sides of the left mandible but no drainable abscess at this time. Regional cellulitis and reactive lymph nodes. Electronically Signed   By: Genevie Ann M.D.   On: 07/16/2019 23:28    ____________________________________________   PROCEDURES  Procedure(s) performed:   Procedures  CRITICAL CARE Performed by: Margette Fast Total critical care time: 35 minutes Critical care time was exclusive of separately billable procedures and treating other patients. Critical care was necessary to treat or prevent imminent or life-threatening deterioration. Critical care was time spent personally by me on  the following activities: development of treatment plan with patient and/or surrogate as well as nursing, discussions with consultants, evaluation of patient's response to treatment, examination of patient, obtaining history from patient or surrogate, ordering and performing treatments and interventions, ordering and review of laboratory studies, ordering and review of radiographic studies, pulse oximetry and re-evaluation of patient's condition.  Alona Bene, MD Emergency  Medicine  ____________________________________________   INITIAL IMPRESSION / ASSESSMENT AND PLAN / ED COURSE  Pertinent labs & imaging results that were available during my care of the patient were reviewed by me and considered in my medical decision making (see chart for details).   Patient presents to the emergency department for evaluation of worsening dental pain with left neck swelling despite being on first Keflex for 2 days then being switched to clindamycin in the ED 2 days prior.  Given the patient's exam I do have concern for developing infection versus deeper space process.  No concern clinically for impending airway collapse or compromise.  Ludwig's angina is a consideration here.   Labs reviewed.  No leukocytosis. CT imaging reviewed. No oral surgery on call here. Will reach out to OMFS/ENT at [facility].   11:53 PM  Spoke with Dr. Roel Cluck with ENT at [facility]. No Oral Surgery on call at [facility] this hour to discuss. They will accept to the ED for evaluation. Will send COVID test and start IV clindamycin.  ____________________________________________  FINAL CLINICAL IMPRESSION(S) / ED DIAGNOSES  Final diagnoses:  Cellulitis of face  Dental infection  Neck swelling    MEDICATIONS GIVEN DURING THIS VISIT:  Medications  clindamycin (CLEOCIN) IVPB 600 mg (has no administration in time range)  LORazepam (ATIVAN) injection 1 mg (1 mg Intravenous Given 07/16/19 2208)  iohexol (OMNIPAQUE) 300 MG/ML solution 75 mL (75 mLs Intravenous Contrast Given 07/16/19 2307)    Note:  This document was prepared using Dragon voice recognition software and may include unintentional dictation errors.  Alona Bene, MD, [facility] Emergency Medicine    Shamyia Grandpre, Arlyss Repress, MD 07/17/19 Jorje Guild

## 2019-07-16 NOTE — ED Triage Notes (Signed)
Pt c/o increased swelling and pain to left side of jaw since she was seen here Sunday. Pt states it is hard to swallow.

## 2019-07-17 LAB — RESPIRATORY PANEL BY RT PCR (FLU A&B, COVID)
Influenza A by PCR: NEGATIVE
Influenza B by PCR: NEGATIVE
SARS Coronavirus 2 by RT PCR: NEGATIVE

## 2019-07-17 NOTE — ED Provider Notes (Addendum)
Patient left at change of shift waiting to be transported to [facility] for a dental infection.  When I went to see patient she is sleeping soundly on her side.  I set her name multiple times and even shook her leg and she did not wake up.  She appears to be breathing without difficulty.  Nurse reports she had gotten Ativan for anxiety when she came to the ED for evaluation.  The ambulance service from [facility] should be here shortly to transport her.  Patient was awake when the transport arrived.  She acknowledges that she was aware she was being transferred to [facility] and is agreeable.   Devoria Albe, MD 07/17/19 [id number]    Devoria Albe, MD 07/17/19 0201

## 2019-07-17 NOTE — ED Notes (Signed)
Report given to [place], RN with [facility].

## 2019-07-17 NOTE — ED Notes (Signed)
Report given to Irene Pap, [facility] ED Charge RN. [phone]
